# Patient Record
Sex: Male | Born: 1970 | Race: White | Hispanic: No | Marital: Married | Smoking: Current every day smoker
Health system: Southern US, Community
[De-identification: ages and names within clinical notes are randomized; demographics above are authoritative.]

## PROBLEM LIST (undated history)

## (undated) DIAGNOSIS — K649 Unspecified hemorrhoids: Secondary | ICD-10-CM

## (undated) HISTORY — DX: Unspecified hemorrhoids: K64.9

---

## 2006-10-28 ENCOUNTER — Emergency Department: Payer: Self-pay | Admitting: Internal Medicine

## 2009-05-11 ENCOUNTER — Emergency Department: Payer: Self-pay | Admitting: Emergency Medicine

## 2009-08-28 ENCOUNTER — Emergency Department: Payer: Self-pay | Admitting: Emergency Medicine

## 2010-03-05 IMAGING — CR DG CHEST 2V
1 series · 2 of 2 positions shown · non-contrast
Comparison: none

REASON FOR EXAM: SOB
COMMENTS:

[Series 1: view not recorded · 0.17mm/px · 2 of 2 slices shown]
[im 1/2]
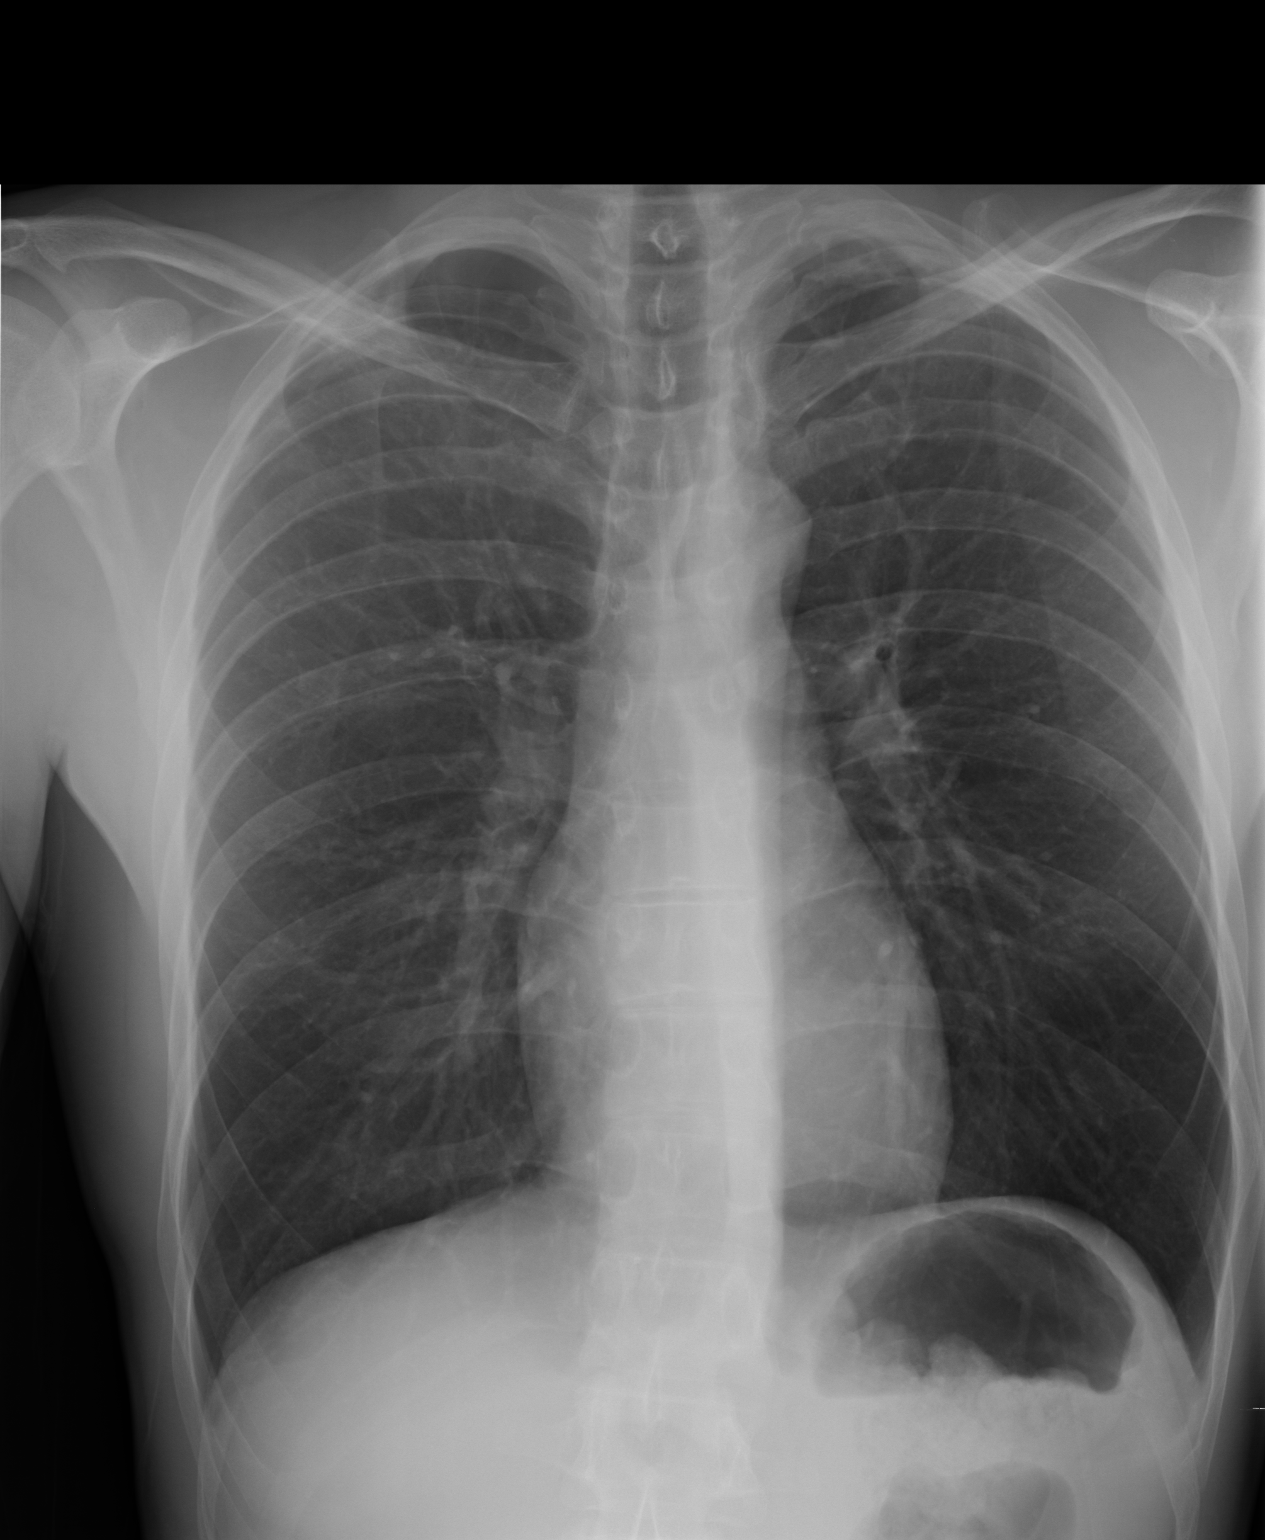
[im 2/2]
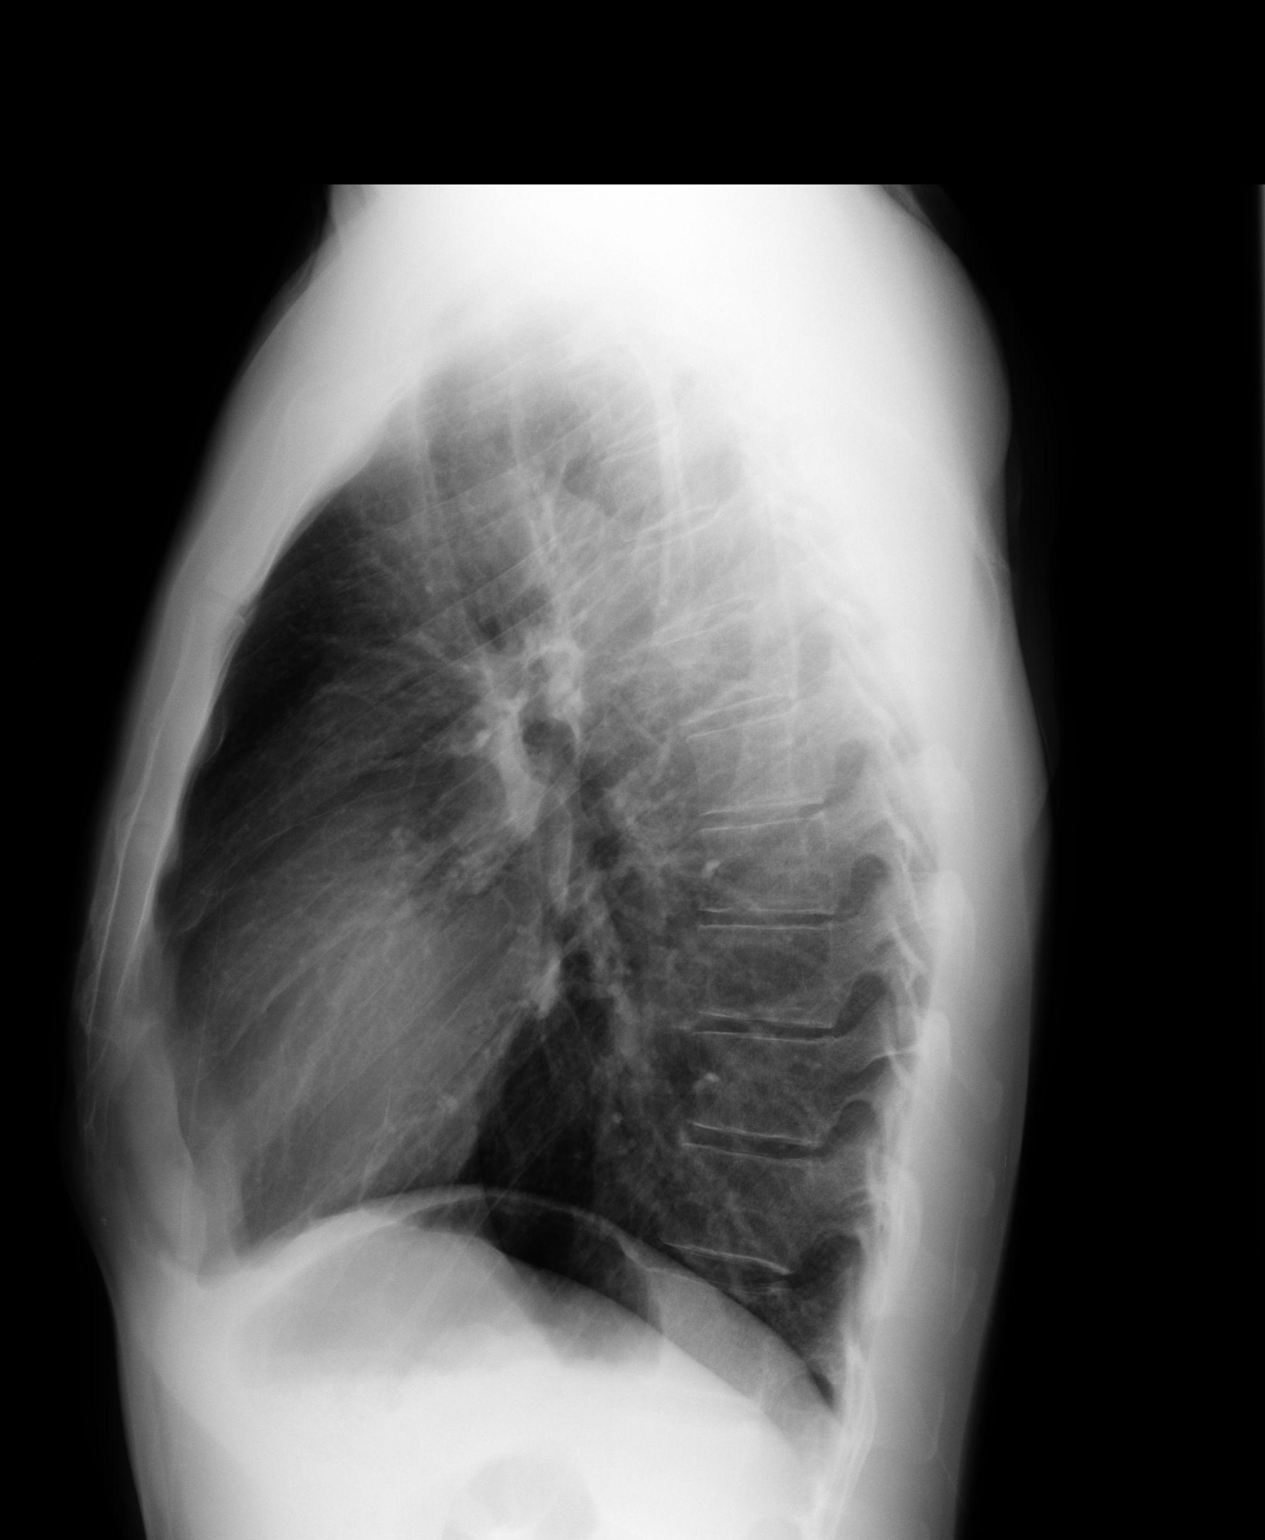

[2 of 2 positions shown; findings below may reference images not displayed]

PROCEDURE:     DXR - DXR CHEST PA (OR AP) AND LATERAL  - August 28, 2009  [DATE]

RESULT:     There are no prior studies for comparison.

The lungs are mildly hyperinflated. There is no focal infiltrate. The
cardiac silhouette is normal in size. There is no pulmonary vascular
congestion. There is no evidence of a pneumothorax nor pleural effusion.
There is a small amount of apical pleural density bilaterally.Minimally
increased perihilar lung markings on the right superiorly are noted.
IMPRESSION: 1. I do not see evidence of focal pneumonia nor CHF.
2. Mildly increased density in the pulmonary apices and in the right
suprahilar region may reflect chronic pulmonary abnormality. There is
hyperinflation as well which could reflect underlying air trapping.

 At minimum a followup PA and lateral chest x-ray following any therapy or
in 10 to 14 days is needed. Chest CT scanning may be ultimately required if
the patient's symptoms persist and remain unexplained.

## 2013-05-07 ENCOUNTER — Emergency Department: Payer: Self-pay | Admitting: Emergency Medicine

## 2014-12-05 DIAGNOSIS — K649 Unspecified hemorrhoids: Secondary | ICD-10-CM

## 2014-12-05 HISTORY — DX: Unspecified hemorrhoids: K64.9

## 2015-12-10 ENCOUNTER — Observation Stay: Payer: Self-pay | Admitting: Anesthesiology

## 2015-12-10 ENCOUNTER — Observation Stay
Admission: EM | Admit: 2015-12-10 | Discharge: 2015-12-11 | Disposition: A | Discharge status: Home / Self Care | Payer: Self-pay | Attending: General Surgery | Admitting: General Surgery

## 2015-12-10 ENCOUNTER — Encounter
Admission: EM | Disposition: A | Discharge status: Home / Self Care | Payer: Self-pay | Source: Home / Self Care | Attending: Emergency Medicine

## 2015-12-10 ENCOUNTER — Encounter: Payer: Self-pay | Admitting: Urgent Care

## 2015-12-10 DIAGNOSIS — K645 Perianal venous thrombosis: Secondary | ICD-10-CM | POA: Insufficient documentation

## 2015-12-10 DIAGNOSIS — Z87891 Personal history of nicotine dependence: Secondary | ICD-10-CM | POA: Insufficient documentation

## 2015-12-10 DIAGNOSIS — K648 Other hemorrhoids: Secondary | ICD-10-CM | POA: Diagnosis present

## 2015-12-10 DIAGNOSIS — K643 Fourth degree hemorrhoids: Principal | ICD-10-CM | POA: Insufficient documentation

## 2015-12-10 DIAGNOSIS — Z880 Allergy status to penicillin: Secondary | ICD-10-CM | POA: Insufficient documentation

## 2015-12-10 HISTORY — PX: HEMORRHOID SURGERY: SHX153

## 2015-12-10 HISTORY — PX: RECTAL EXAM UNDER ANESTHESIA: SHX6399

## 2015-12-10 LAB — BASIC METABOLIC PANEL
ANION GAP: 5 (ref 5–15)
BUN: 15 mg/dL (ref 6–20)
CALCIUM: 9.1 mg/dL (ref 8.9–10.3)
CO2: 29 mmol/L (ref 22–32)
Chloride: 105 mmol/L (ref 101–111)
Creatinine, Ser: 1.08 mg/dL (ref 0.61–1.24)
GFR calc non Af Amer: >60 mL/min (ref >60)
Glucose, Bld: 90 mg/dL (ref 65–99)
Potassium: 4.2 mmol/L (ref 3.5–5.1)
SODIUM: 139 mmol/L (ref 135–145)

## 2015-12-10 LAB — APTT: aPTT: 33 seconds (ref 24–36)

## 2015-12-10 LAB — CBC
HEMATOCRIT: 42.8 % (ref 40.0–52.0)
Hemoglobin: 14.6 g/dL (ref 13.0–18.0)
MCH: 29.4 pg (ref 26.0–34.0)
MCHC: 34.1 g/dL (ref 32.0–36.0)
MCV: 86.1 fL (ref 80.0–100.0)
PLATELETS: 213 10*3/uL (ref 150–440)
RBC: 4.97 MIL/uL (ref 4.40–5.90)
RDW: 14.3 % (ref 11.5–14.5)
WBC: 7.3 10*3/uL (ref 3.8–10.6)

## 2015-12-10 LAB — PROTIME-INR
INR: 1.15
Prothrombin Time: 14.9 seconds (ref 11.4–15.0)

## 2015-12-10 SURGERY — HEMORRHOIDECTOMY
Anesthesia: General | Site: Rectum | Wound class: Dirty or Infected

## 2015-12-10 MED ORDER — ONDANSETRON HCL 4 MG/2ML IJ SOLN
4.0000 mg | Freq: Once | INTRAMUSCULAR | Status: AC
  Administered 2015-12-10: 4 mg via INTRAVENOUS

## 2015-12-10 MED ORDER — MORPHINE SULFATE (PF) 4 MG/ML IV SOLN
4.0000 mg | INTRAVENOUS | Status: DC | PRN
  Administered 2015-12-11 (×2): 4 mg via INTRAVENOUS
  Filled 2015-12-10 (×2): qty 1

## 2015-12-10 MED ORDER — MORPHINE SULFATE (PF) 4 MG/ML IV SOLN
INTRAVENOUS | Status: AC
  Administered 2015-12-10: 4 mg via INTRAVENOUS
  Filled 2015-12-10: qty 1

## 2015-12-10 MED ORDER — LACTATED RINGERS IV SOLN
INTRAVENOUS | Status: DC
  Administered 2015-12-10: 16:00:00 via INTRAVENOUS

## 2015-12-10 MED ORDER — KETAMINE HCL 10 MG/ML IJ SOLN
INTRAMUSCULAR | Status: DC | PRN
  Administered 2015-12-10: 20 mg via INTRAVENOUS

## 2015-12-10 MED ORDER — KETOROLAC TROMETHAMINE 30 MG/ML IJ SOLN
INTRAMUSCULAR | Status: DC | PRN
  Administered 2015-12-10: 30 mg via INTRAVENOUS

## 2015-12-10 MED ORDER — ONDANSETRON 4 MG PO TBDP: 4.0000 mg | ORAL_TABLET | Freq: Four times a day (QID) | ORAL | Status: DC | PRN

## 2015-12-10 MED ORDER — FENTANYL CITRATE (PF) 100 MCG/2ML IJ SOLN: 25.0000 ug | INTRAMUSCULAR | Status: DC | PRN

## 2015-12-10 MED ORDER — LIDOCAINE HCL (CARDIAC) 20 MG/ML IV SOLN
INTRAVENOUS | Status: DC | PRN
  Administered 2015-12-10: 40 mg via INTRAVENOUS

## 2015-12-10 MED ORDER — MIDAZOLAM HCL 2 MG/2ML IJ SOLN
INTRAMUSCULAR | Status: DC | PRN
  Administered 2015-12-10: 2 mg via INTRAVENOUS

## 2015-12-10 MED ORDER — MORPHINE SULFATE (PF) 4 MG/ML IV SOLN
4.0000 mg | INTRAVENOUS | Status: DC | PRN
Start: 2015-12-10 — End: 2015-12-10

## 2015-12-10 MED ORDER — FENTANYL CITRATE (PF) 100 MCG/2ML IJ SOLN
INTRAMUSCULAR | Status: DC | PRN
  Administered 2015-12-10 (×2): 100 ug via INTRAVENOUS

## 2015-12-10 MED ORDER — LIDOCAINE HCL (PF) 4 % IJ SOLN
INTRAMUSCULAR | Status: DC | PRN
  Administered 2015-12-10: 4 mL via RESPIRATORY_TRACT

## 2015-12-10 MED ORDER — DOCUSATE SODIUM 100 MG PO CAPS
100.0000 mg | ORAL_CAPSULE | Freq: Two times a day (BID) | ORAL | Status: DC
  Administered 2015-12-11: 100 mg via ORAL
  Filled 2015-12-10 (×5): qty 1

## 2015-12-10 MED ORDER — OXYCODONE-ACETAMINOPHEN 5-325 MG PO TABS: 1.0000 | ORAL_TABLET | ORAL | Status: DC | PRN

## 2015-12-10 MED ORDER — ONDANSETRON HCL 4 MG/2ML IJ SOLN
INTRAMUSCULAR | Status: AC
  Administered 2015-12-10: 4 mg via INTRAVENOUS
  Filled 2015-12-10: qty 2

## 2015-12-10 MED ORDER — ACETAMINOPHEN 10 MG/ML IV SOLN
INTRAVENOUS | Status: DC | PRN
  Administered 2015-12-10: 1000 mg via INTRAVENOUS

## 2015-12-10 MED ORDER — NALOXONE HCL 0.4 MG/ML IJ SOLN
INTRAMUSCULAR | Status: DC | PRN
  Administered 2015-12-10: 40 ug via INTRAVENOUS

## 2015-12-10 MED ORDER — DIPHENHYDRAMINE HCL 50 MG/ML IJ SOLN: 25.0000 mg | Freq: Four times a day (QID) | INTRAMUSCULAR | Status: DC | PRN

## 2015-12-10 MED ORDER — ONDANSETRON HCL 4 MG/2ML IJ SOLN: 4.0000 mg | Freq: Once | INTRAMUSCULAR | Status: DC | PRN

## 2015-12-10 MED ORDER — HYDROCORTISONE ACETATE 25 MG RE SUPP
25.0000 mg | Freq: Two times a day (BID) | RECTAL | Status: DC
  Filled 2015-12-10 (×4): qty 1

## 2015-12-10 MED ORDER — PROPOFOL 10 MG/ML IV BOLUS
INTRAVENOUS | Status: DC | PRN
  Administered 2015-12-10: 200 mg via INTRAVENOUS

## 2015-12-10 MED ORDER — LIDOCAINE-EPINEPHRINE (PF) 1 %-1:200000 IJ SOLN
INTRAMUSCULAR | Status: DC | PRN
  Administered 2015-12-10: 16 mL via INTRADERMAL

## 2015-12-10 MED ORDER — ONDANSETRON HCL 4 MG/2ML IJ SOLN: 4.0000 mg | Freq: Once | INTRAMUSCULAR | Status: AC

## 2015-12-10 MED ORDER — SUCCINYLCHOLINE CHLORIDE 20 MG/ML IJ SOLN
INTRAMUSCULAR | Status: DC | PRN
  Administered 2015-12-10: 100 mg via INTRAVENOUS

## 2015-12-10 MED ORDER — ONDANSETRON HCL 4 MG/2ML IJ SOLN: 4.0000 mg | Freq: Four times a day (QID) | INTRAMUSCULAR | Status: DC | PRN

## 2015-12-10 MED ORDER — SODIUM CHLORIDE 0.9 % IV BOLUS (SEPSIS)
1000.0000 mL | Freq: Once | INTRAVENOUS | Status: AC
  Administered 2015-12-10: 1000 mL via INTRAVENOUS

## 2015-12-10 MED ORDER — HYDRALAZINE HCL 20 MG/ML IJ SOLN
10.0000 mg | INTRAMUSCULAR | Status: DC | PRN
Start: 2015-12-10 — End: 2015-12-11

## 2015-12-10 MED ORDER — DIPHENHYDRAMINE HCL 25 MG PO CAPS: 25.0000 mg | ORAL_CAPSULE | Freq: Four times a day (QID) | ORAL | Status: DC | PRN

## 2015-12-10 MED ORDER — MORPHINE SULFATE (PF) 4 MG/ML IV SOLN
4.0000 mg | Freq: Once | INTRAVENOUS | Status: AC
  Administered 2015-12-10: 4 mg via INTRAVENOUS

## 2015-12-10 MED ORDER — MORPHINE SULFATE (PF) 4 MG/ML IV SOLN: 4.0000 mg | Freq: Once | INTRAVENOUS | Status: AC

## 2015-12-10 SURGICAL SUPPLY — 28 items
BRIEF STRETCH MATERNITY 2XLG (MISCELLANEOUS) ×4 IMPLANT
DRAPE LAPAROTOMY 100X77 ABD (DRAPES) ×4 IMPLANT
DRAPE LEGGINS SURG 28X43 STRL (DRAPES) ×4 IMPLANT
ELECT CAUTERY NEEDLE TIP 1.0 (MISCELLANEOUS)
ELECT LIGASURE SHORT 9 REUSE (ELECTRODE) IMPLANT
ELECTRODE CAUTERY NEDL TIP 1.0 (MISCELLANEOUS) IMPLANT
GAUZE PACKING IODOFORM 1X5 (MISCELLANEOUS) ×4 IMPLANT
GLOVE BIO SURGEON STRL SZ7.5 (GLOVE) ×12 IMPLANT
GLOVE INDICATOR 8.0 STRL GRN (GLOVE) ×4 IMPLANT
GOWN STRL REUS W/ TWL LRG LVL3 (GOWN DISPOSABLE) ×4 IMPLANT
GOWN STRL REUS W/TWL LRG LVL3 (GOWN DISPOSABLE) ×4
KIT RM TURNOVER CYSTO AR (KITS) ×4 IMPLANT
LABEL OR SOLS (LABEL) ×4 IMPLANT
NEEDLE HYPO 25X1 1.5 SAFETY (NEEDLE) ×4 IMPLANT
NS IRRIG 500ML POUR BTL (IV SOLUTION) ×4 IMPLANT
PACK BASIN MINOR ARMC (MISCELLANEOUS) ×4 IMPLANT
PAD ABD DERMACEA PRESS 5X9 (GAUZE/BANDAGES/DRESSINGS) ×4 IMPLANT
PAD GROUND ADULT SPLIT (MISCELLANEOUS) ×4 IMPLANT
PAD PREP 24X41 OB/GYN DISP (PERSONAL CARE ITEMS) ×4 IMPLANT
SOL PREP PVP 2OZ (MISCELLANEOUS) ×4
SOLUTION PREP PVP 2OZ (MISCELLANEOUS) ×2 IMPLANT
SURGILUBE 2OZ TUBE FLIPTOP (MISCELLANEOUS) ×8 IMPLANT
SUT CHROMIC 2 0 SH (SUTURE) ×16 IMPLANT
SUT VIC AB 2-0 SH 27 (SUTURE)
SUT VIC AB 2-0 SH 27XBRD (SUTURE) IMPLANT
SUT VIC AB 3-0 SH 27 (SUTURE)
SUT VIC AB 3-0 SH 27X BRD (SUTURE) IMPLANT
SYRINGE 10CC LL (SYRINGE) ×4 IMPLANT

## 2015-12-10 NOTE — ED Notes (Signed)
Patient presents with c/o "fist sized hemorrhoids" that have been bothering him since Monday when "something happened". Patient reports that hemorrhoids have swollen, are painful, and bleeding to the point where he cannot get them to stop. [facility] and was advised of a 10 hour wait. Patient reports that he is unable to sit down.

## 2015-12-10 NOTE — Transfer of Care (Signed)
Immediate Anesthesia Transfer of Care Note  Patient: Jerome Short  Procedure(s) Performed: Procedure(s): HEMORRHOIDECTOMY (N/A) RECTAL EXAM UNDER ANESTHESIA  Patient Location: PACU  Anesthesia Type:General  Level of Consciousness: sedated  Airway & Oxygen Therapy: Patient Spontanous Breathing and Patient connected to nasal cannula oxygen  Post-op Assessment: Report given to RN and Post -op Vital signs reviewed and stable  Post vital signs: Reviewed and stable  Last Vitals:  Filed Vitals:   12/10/15 1401 12/10/15 1437  BP:  130/82  Pulse: 50 60  Temp:  36.4 C  Resp:  18    Complications: No apparent anesthesia complications

## 2015-12-10 NOTE — H&P (Signed)
Patient ID: Jerome Short, male   DOB: 03/02/1971, 45 y.o.   MRN: [id number]  CC: Hemorrhoids  HPI Jerome Short is a 45 y.o. male who presents emergency department for evaluation of painful, bleeding hemorrhoids that have been out and bleeding since Monday of this week. Patient states that he has had numerous battles with hemorrhoids over his life ovaries never had anything like this before. Patient states that on Monday he passed a hard stool after which his hemorrhoids came out and started to bleed. They have not gone back NSAIDs. In fact there continued to get larger and more painful throughout the week. This is what brought him to the emergency room today. Patient has no other significant medical history. He denies any fevers, chills, nausea, vomiting, chest pain, shortness of breath. His last bowel movement was yesterday and was loose.  HPI  History reviewed. No pertinent past medical history. patient takes no medications  History reviewed. No pertinent past surgical history. patient has never had any surgery  No family history on file. patient denies any knowledge of any significant family medical history.  Social History Social History  Substance Use Topics  . Smoking status: Former Games developermoker  . Smokeless tobacco: None  . Alcohol Use: No    Allergies  Allergen Reactions  . Penicillins Hives    No current facility-administered medications for this encounter.   No current outpatient prescriptions on file.     Review of Systems A multi-point review of systems was asked and was negative except for the findings I can in the history of present illness  Physical Exam Blood pressure 124/81, pulse 53, temperature 98 F (36.7 C), temperature source Oral, resp. rate 17, height 5\' 9"  (1.753 m), weight 68.04 kg (150 lb), SpO2 98 %. CONSTITUTIONAL: No acute distress. EYES: Pupils are equal, round, and reactive to light, Sclera are non-icteric. EARS, NOSE, MOUTH AND  THROAT: The oropharynx is clear. The oral mucosa is pink and moist. Hearing is intact to voice. LYMPH NODES:  Lymph nodes in the neck are normal. RESPIRATORY:  Lungs are clear. There is normal respiratory effort, with equal breath sounds bilaterally, and without pathologic use of accessory muscles. CARDIOVASCULAR: Heart is regular without murmurs, gallops, or rubs. GI: The abdomen is soft, nontender, and nondistended. There are no palpable masses. There is no hepatosplenomegaly. There are normal bowel sounds in all quadrants. GU: Rectal Exam shows 3 columns of grade 4 internal hemorrhoids with clotted blood on the right anterior and posterior columns. They are exquisitely tender to palpation and unable to be reduced in the emergency room. Patient was unable to tolerate a full rectal exam secondary to pain. MUSCULOSKELETAL: Normal muscle strength and tone. No cyanosis or edema.   SKIN: Turgor is good and there are no pathologic skin lesions or ulcers. NEUROLOGIC: Motor and sensation is grossly normal. Cranial nerves are grossly intact. PSYCH:  Oriented to person, place and time. Affect is normal.  Data Reviewed There are no images and labs associated with this.  I have personally reviewed the patient's imaging, laboratory findings and medical records.    Assessment    3 column grade 4 internal hemorrhoids with evidence of active bleed.     Plan    Grade 4 internal hemorrhoids. Discussed with the patient that his hemorrhoids have progressed to the point where surgery is indicated. However, patient was eating and drinking immediately before I entered the room this morning. Discussed that there would be an 8  hour wait before he go to the operating room. Discussed the procedure of an open hemorrhoidectomy in detail. Also discussed that he be likely performed by my partner Dr. Excell Seltzer as it'll be after 7:00. Patient voiced understanding. We'll admit to the hospital for hydration, labs, pain control.       Time spent with the patient was 30 minutes, with more than 50% of the time spent in face-to-face education, counseling and care coordination.     Ricarda Frame, MD FACS General Surgeon 12/10/2015, 1:42 PM

## 2015-12-10 NOTE — Progress Notes (Signed)
Preoperative Review   Patient is met in the preoperative holding area. The history is reviewed in the chart and with the patient. I personally reviewed the options and rationale as well as the risks of this procedure that have been previously discussed with the patient. All questions asked by the patient and/or family were answered to their satisfaction.  Patient agrees to proceed with this procedure at this time.  Richard E Cooper M.D. FACS  

## 2015-12-10 NOTE — Discharge Instructions (Signed)
May shower Use anusol suppositories twice a day for five days Use preparation H once a day for two weeks after anusol use discontinued See Dr Excell Seltzerooper in office in 10 days

## 2015-12-10 NOTE — ED Provider Notes (Signed)
[facility] Emergency Department Provider Note  ____________________________________________  Time seen: 6:20 AM  I have reviewed the triage vital signs and the nursing notes.   HISTORY  Chief Complaint Hemorrhoids     HPI Jerome Short is a 45 y.o. male presents with "fist size hemorrhoids" currently 10 out of 10 pain. Patient states symptoms started on Monday and has progressively worsened since that time. Patient states inability to sit secondary to discomfort. Patient admits to having "problems with hemorrhoids for many years.     Past medical history Hemorrhoids There are no active problems to display for this patient.   Past surgical history None No current outpatient prescriptions on file.  Allergies Penicillins  No family history on file.  Social History Social History  Substance Use Topics  . Smoking status: Former Games developermoker  . Smokeless tobacco: None  . Alcohol Use: No    Review of Systems  Constitutional: Negative for fever. Eyes: Negative for visual changes. ENT: Negative for sore throat. Cardiovascular: Negative for chest pain. Respiratory: Negative for shortness of breath. Gastrointestinal: Negative for abdominal pain, vomiting and diarrhea. Positive for Rectal pain and bleeding Genitourinary: Negative for dysuria. Musculoskeletal: Negative for back pain. Skin: Negative for rash. Neurological: Negative for headaches, focal weakness or numbness.   10-point ROS otherwise negative.  ____________________________________________   PHYSICAL EXAM:  VITAL SIGNS: ED Triage Vitals  Enc Vitals Group     BP 12/10/15 0552 131/93 mmHg     Pulse Rate 12/10/15 0552 98     Resp 12/10/15 0552 16     Temp 12/10/15 0552 98 F (36.7 C)     Temp Source 12/10/15 0552 Oral     SpO2 12/10/15 0552 96 %     Weight 12/10/15 0552 150 lb (68.04 kg)     Height 12/10/15 0552 5\' 9"  (1.753 m)     Head Cir --      Peak Flow --      Pain Score 12/10/15 0553 10     Pain Loc --      Pain Edu? --      Excl. in GC? --      Constitutional: Alert and oriented. Well appearing and in no distress. Eyes: Conjunctivae are normal. PERRL. Normal extraocular movements. ENT   Head: Normocephalic and atraumatic.   Nose: No congestion/rhinnorhea.   Mouth/Throat: Mucous membranes are moist.   Neck: No stridor. Hematological/Lymphatic/Immunilogical: No cervical lymphadenopathy. Cardiovascular: Normal rate, regular rhythm. Normal and symmetric distal pulses are present in all extremities. No murmurs, rubs, or gallops. Respiratory: Normal respiratory effort without tachypnea nor retractions. Breath sounds are clear and equal bilaterally. No wheezes/rales/rhonchi. Gastrointestinal: Soft and nontender. No distention. There is no CVA tenderness. Large golf ball size thrombosed hemorrhoid noted Genitourinary: deferred Musculoskeletal: Nontender with normal range of motion in all extremities. No joint effusions.  No lower extremity tenderness nor edema. Neurologic:  Normal speech and language. No gross focal neurologic deficits are appreciated. Speech is normal.  Skin:  Skin is warm, dry and intact. No rash noted. Psychiatric: Mood and affect are normal. Speech and behavior are normal. Patient exhibits appropriate insight and judgment.   INITIAL IMPRESSION / ASSESSMENT AND PLAN / ED COURSE  Pertinent labs & imaging results that were available during my care of the patient were reviewed by me and considered in my medical decision making (see chart for details).  Given size of thrombosed hemorrhoid Dr. Colette RibasByrd general surgeon consulted who will evaluate patient in the  emergency department ____________________________________________   FINAL CLINICAL IMPRESSION(S) / ED DIAGNOSES  Final diagnoses:  Thrombosed external hemorrhoid      Darci Current, MD 12/10/15 [phone]

## 2015-12-10 NOTE — Anesthesia Preprocedure Evaluation (Addendum)
Anesthesia Evaluation  Patient identified by MRN, date of birth, ID band Patient awake    Reviewed: Allergy & Precautions, NPO status , Patient's Chart, lab work & pertinent test results  Airway Mallampati: II  TM Distance: >3 FB Neck ROM: Full    Dental  (+) Poor Dentition, Upper Dentures, Partial Lower, Chipped   Pulmonary former smoker,    Pulmonary exam normal breath sounds clear to auscultation       Cardiovascular negative cardio ROS Normal cardiovascular exam     Neuro/Psych negative neurological ROS  negative psych ROS   GI/Hepatic negative GI ROS, Neg liver ROS,   Endo/Other  negative endocrine ROS  Renal/GU negative Renal ROS  negative genitourinary   Musculoskeletal negative musculoskeletal ROS (+)   Abdominal Normal abdominal exam  (+)   Peds negative pediatric ROS (+)  Hematology negative hematology ROS (+)   Anesthesia Other Findings   Reproductive/Obstetrics                            Anesthesia Physical Anesthesia Plan  ASA: II and emergent  Anesthesia Plan: General   Post-op Pain Management:    Induction: Intravenous  Airway Management Planned: Oral ETT  Additional Equipment:   Intra-op Plan:   Post-operative Plan: Extubation in OR  Informed Consent: I have reviewed the patients History and Physical, chart, labs and discussed the procedure including the risks, benefits and alternatives for the proposed anesthesia with the patient or authorized representative who has indicated his/her understanding and acceptance.   Dental advisory given  Plan Discussed with: CRNA and Surgeon  Anesthesia Plan Comments:        Anesthesia Quick Evaluation

## 2015-12-10 NOTE — Anesthesia Procedure Notes (Signed)
Procedure Name: Intubation Date/Time: 12/10/2015 9:44 PM Performed by: Shirlee LimerickMARION, Jacalyn Biggs Pre-anesthesia Checklist: Patient identified, Emergency Drugs available, Suction available and Patient being monitored Patient Re-evaluated:Patient Re-evaluated prior to inductionOxygen Delivery Method: Circle system utilized Preoxygenation: Pre-oxygenation with 100% oxygen Intubation Type: IV induction Laryngoscope Size: Mac and 3 Grade View: Grade I Tube type: Oral Tube size: 7.0 mm Number of attempts: 1 Placement Confirmation: ETT inserted through vocal cords under direct vision,  positive ETCO2 and breath sounds checked- equal and bilateral Secured at: 23 cm Tube secured with: Tape Dental Injury: Teeth and Oropharynx as per pre-operative assessment

## 2015-12-10 NOTE — Op Note (Signed)
12/10/2015  10:52 PM  PATIENT:  Davionte L Veach II  45 y.o. male  PRE-OPERATIVE DIAGNOSIS: Grade 4 hemorrhoids  POST-OPERATIVE DIAGNOSIS:  Same  PROCEDURE: Examination under anesthesia and excision of internal and external hemorrhoids in 3 columns  SURGEON:  Lattie Hawichard E Madolin Twaddle MD, FACS   ANESTHESIA:   Gen. with endotracheal tube   Details of Procedure: This a patient with severe both thrombosed and bleeding hemorrhoids with pain and bleeding. After we discussed rationale for surgery the options of observation risk bleeding infection and recurrence and the likelihood that he would have ongoing pain and ongoing bleeding pressure. A time after the operation. This reviewed for him the preop holding area he understood and agreed to proceed.  Patient was induced general anesthesia a surgical positive was performed he was placed in high lithotomy position and an examination under anesthesia was performed with digital rectal exam. Of note was extremely inflamed and enlarged edematous external hemorrhoids with thrombosis as well as large internal hemorrhoids with bleeding and erosion. This involved 3 columns.  Each column was handled individually by placing 1% lidocaine with epinephrine submucosally grasping with an Allis clamp and placing a 2-0 chromic suture at the apex and then let a lenticular shaped incision was utilized to excise the bleeding internal hemorrhoids and thrombosed external hemorrhoids. Running locking suture was performed to close the wound.  This was done on each of the 3 tremendously inflamed columns. Inspection was performed there was no further bleeding additional lidocaine was placed. Patient hour to the procedure well the workup occasions he was taken down from high lithotomy position and an AVD pad was placed he was then taken to the recovery room in stable condition to be admitted for continued care.   Lattie Hawichard E Adain Geurin, MD FACS

## 2015-12-11 ENCOUNTER — Encounter: Payer: Self-pay | Admitting: Surgery

## 2015-12-11 MED ORDER — OXYCODONE-ACETAMINOPHEN 5-325 MG PO TABS: 1.0000 | ORAL_TABLET | ORAL | Status: DC | PRN

## 2015-12-11 MED ORDER — HYDROCORTISONE ACETATE 25 MG RE SUPP: 25.0000 mg | Freq: Two times a day (BID) | RECTAL | Status: DC

## 2015-12-11 MED ORDER — LIDOCAINE 5 % EX OINT: 1.0000 "application " | TOPICAL_OINTMENT | CUTANEOUS | Status: DC | PRN

## 2015-12-11 MED ORDER — DOCUSATE SODIUM 100 MG PO CAPS: 100.0000 mg | ORAL_CAPSULE | Freq: Two times a day (BID) | ORAL | Status: AC

## 2015-12-11 NOTE — Final Progress Note (Signed)
1 Day Post-Op   Subjective:  Patient had uneventful night.  Vital signs in last 24 hours: Temp:  [97.4 F (36.3 C)-99 F (37.2 C)] 97.7 F (36.5 C) (01/06 0552) Pulse Rate:  [50-79] 68 (01/06 0552) Resp:  [11-19] 19 (01/06 0552) BP: (114-140)/(62-96) 135/80 mmHg (01/06 0552) SpO2:  [94 %-100 %] 98 % (01/06 0552)    Intake/Output from previous day: 01/05 0701 - 01/06 0700 In: 2340 [P.O.:300; I.V.:2040] Out: 25 [Blood:25]  GI: soft, non-tender; bowel sounds normal; no masses,  no organomegaly  Lab Results:  CBC  Recent Labs  12/10/15 1407  WBC 7.3  HGB 14.6  HCT 42.8  PLT 213   CMP     Component Value Date/Time   NA 139 12/10/2015 1407   K 4.2 12/10/2015 1407   CL 105 12/10/2015 1407   CO2 29 12/10/2015 1407   GLUCOSE 90 12/10/2015 1407   BUN 15 12/10/2015 1407   CREATININE 1.08 12/10/2015 1407   CALCIUM 9.1 12/10/2015 1407   GFRNONAA >60 12/10/2015 1407   GFRAA >60 12/10/2015 1407   PT/INR  Recent Labs  12/10/15 1407  LABPROT 14.9  INR 1.15    Studies/Results: No results found.  Assessment/Plan: 45 year old male status post 3 column open hemorrhoidectomy for bleeding grade 4 internal hemorrhoids. Discussed appropriate use of as needed medications his outpatient status. Patient voiced understanding and wishes to go home. We'll discharge home with follow-up in clinic in 2 weeks.   Ricarda Frameharles Berlin Mokry, MD FACS General Surgeon  12/11/2015

## 2015-12-11 NOTE — Progress Notes (Signed)
Pt to be discharged per MD order. Iv removed. Instructions reviewed with pt. All questions answered. Provided education on supp administration. Pt prefers to walk out as he can not tolerate sitting

## 2015-12-11 NOTE — Discharge Summary (Signed)
Patient ID: Jerome Short MRN: [id number] DOB/AGE: 45-May-1972 45 y.o.  Admit date: 12/10/2015 Discharge date: 12/11/2015  Discharge Diagnoses:  Grade 4 internal hemorrhoids  Procedures Performed: 3 column open hemorrhoidectomy  Discharged Condition: good  Hospital Course: Patient admitted to observation secondary to grade 4 internal hemorrhoids. Taken to the operating room for an open hemorrhoidectomy last night without any event. Tolerated she well. He will discharged home on postoperative day #1.  Discharge Orders:  discharge home  Disposition: Home  Discharge Medications:  Current facility-administered medications:   .  docusate sodium (COLACE) capsule 100 mg, 100 mg, Oral, BID, Lattie Hawichard E Cooper, MD, 100 mg at 12/11/15 0010 .  hydrocortisone (ANUSOL-HC) suppository 25 mg, 25 mg, Rectal, BID, Lattie Hawichard E Cooper, MD, 25 mg at 12/11/15 0010 .  oxyCODONE-acetaminophen (PERCOCET/ROXICET) 5-325 MG per tablet 1 tablet, 1 tablet, Oral, Q4H PRN, Lattie Hawichard E Cooper, MD  Follwup: Follow-up Information    Follow up with Dionne Miloichard Cooper, MD In 10 days.   Specialty:  Surgery   Why:  For wound re-check   Contact information:   [address] [place] [id number] [phone]       Signed: Ricarda FrameCharles Lawonda Pretlow 12/11/2015, 8:36 AM

## 2015-12-14 LAB — SURGICAL PATHOLOGY

## 2015-12-14 NOTE — Anesthesia Postprocedure Evaluation (Signed)
Anesthesia Post Note  Patient: Linley L Nohr II  Procedure(s) Performed: Procedure(s) (LRB): HEMORRHOIDECTOMY (N/A) RECTAL EXAM UNDER ANESTHESIA  Patient location during evaluation: PACU Anesthesia Type: General Level of consciousness: awake Pain management: pain level controlled Vital Signs Assessment: post-procedure vital signs reviewed and stable Respiratory status: spontaneous breathing Cardiovascular status: blood pressure returned to baseline Anesthetic complications: no    Last Vitals:  Filed Vitals:   12/11/15 0552 12/11/15 0855  BP: 135/80 146/99  Pulse: 68 90  Temp: 36.5 C 36.8 C  Resp: 19 20    Last Pain:  Filed Vitals:   12/11/15 0959  PainSc: 4                  Anvi Mangal

## 2015-12-15 ENCOUNTER — Telehealth: Payer: Self-pay

## 2015-12-15 NOTE — Telephone Encounter (Signed)
Post-discharge call made to patient at this time. Received a busy signal. Will try back at a later time.

## 2015-12-21 NOTE — Telephone Encounter (Signed)
Called once again at this time. Received a busy signal once again. Unable to leave voicemail.

## 2015-12-25 ENCOUNTER — Encounter: Payer: Self-pay | Admitting: Surgery

## 2016-01-05 ENCOUNTER — Telehealth: Payer: Self-pay | Admitting: Surgery

## 2016-01-05 ENCOUNTER — Encounter: Payer: Self-pay | Admitting: Surgery

## 2016-01-05 NOTE — Telephone Encounter (Signed)
Left a message with patients wife that he MUST call and reschedule appointment. Patients voice mail full

## 2016-01-06 ENCOUNTER — Telehealth: Payer: Self-pay

## 2016-01-06 ENCOUNTER — Telehealth: Payer: Self-pay | Admitting: Surgery

## 2016-01-06 NOTE — Telephone Encounter (Signed)
Patient has been a no show for appointments on 1/20 and 1/3. I know that you have spoke with me about each one and have called patient on multiple attempts. Please call patient again and ensure that he has a post-op appointment is made as patient has abnormal pathology and needs to be seen in office as soon as possible.

## 2016-01-06 NOTE — Telephone Encounter (Signed)
Attempted to call patient to reschedule appointment but his voice mail isn't set up so I can leave a message but I did call and leave a voice message with his wife for him to call and reschedule.

## 2016-01-07 NOTE — Telephone Encounter (Signed)
Called patient at this time. He states that he has not been in because of his outstanding balance with hospital that he is worried about paying. Explained to patient that this visit would be covered under his surgery costs and he would not need to worry about a co-pay for his post-op appointment. Patient agrees to come in on 01/29/16. Placed on schedule with Dr. Excell Seltzer. Also, spoke with patient about [facility]. Application sent to patient at this time via mail.

## 2016-01-07 NOTE — Telephone Encounter (Signed)
Left a voice message with patient to call the office to reschedule appointment to discuss path. Report.

## 2016-01-29 ENCOUNTER — Ambulatory Visit (INDEPENDENT_AMBULATORY_CARE_PROVIDER_SITE_OTHER): Payer: Self-pay | Admitting: Surgery

## 2016-01-29 ENCOUNTER — Encounter: Payer: Self-pay | Admitting: Surgery

## 2016-01-29 VITALS — BP 137/78 | HR 83 | Temp 98.4°F | Ht 69.0 in | Wt 152.4 lb

## 2016-01-29 DIAGNOSIS — K645 Perianal venous thrombosis: Secondary | ICD-10-CM

## 2016-01-29 NOTE — Progress Notes (Signed)
Outpatient postop visit  01/29/2016  Jerome Short is an 45 y.o. male.    Procedure: Hemorrhoidectomy  CC: No pain  HPI: Patient had an emergency hemorrhoidectomy for multiple thrombosis with grade 4 hemorrhoids. He states that the first 2 weeks were difficult the first 5 days for excruciating but now he is having no plain and no bleeding he has dealt with this for many many years and states that this is the best he is ever been.  Medications reviewed.    Physical Exam:  There were no vitals taken for this visit.    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 I talked to the pathologist who reviewed these and we discussed the pathophysiology of this condition. Currently the patient is doing very well I will arrange for him to have a colonoscopy and reexamine him in a few months. If there are ever any changes concerning his exam I would send him to a colorectal surgeon for further considerations.  Lattie Haw, MD, FACS

## 2016-01-29 NOTE — Patient Instructions (Signed)
Follow-up with our office in 3 months. Call to make your appointment.  You will also need a Colonoscopy set-up for sometime within this year. Please call our office when you are ready to get this done.

## 2017-06-23 ENCOUNTER — Encounter: Payer: Self-pay | Admitting: Emergency Medicine

## 2017-06-23 ENCOUNTER — Emergency Department
Admission: EM | Admit: 2017-06-23 | Discharge: 2017-06-23 | Disposition: A | Discharge status: Home / Self Care | Payer: Self-pay | Attending: Emergency Medicine | Admitting: Emergency Medicine

## 2017-06-23 DIAGNOSIS — X58XXXA Exposure to other specified factors, initial encounter: Secondary | ICD-10-CM | POA: Insufficient documentation

## 2017-06-23 DIAGNOSIS — Z79899 Other long term (current) drug therapy: Secondary | ICD-10-CM | POA: Insufficient documentation

## 2017-06-23 DIAGNOSIS — F17210 Nicotine dependence, cigarettes, uncomplicated: Secondary | ICD-10-CM | POA: Insufficient documentation

## 2017-06-23 DIAGNOSIS — Y929 Unspecified place or not applicable: Secondary | ICD-10-CM | POA: Insufficient documentation

## 2017-06-23 DIAGNOSIS — T1501XA Foreign body in cornea, right eye, initial encounter: Secondary | ICD-10-CM | POA: Insufficient documentation

## 2017-06-23 DIAGNOSIS — T1591XA Foreign body on external eye, part unspecified, right eye, initial encounter: Secondary | ICD-10-CM

## 2017-06-23 DIAGNOSIS — S0501XA Injury of conjunctiva and corneal abrasion without foreign body, right eye, initial encounter: Secondary | ICD-10-CM

## 2017-06-23 DIAGNOSIS — Y999 Unspecified external cause status: Secondary | ICD-10-CM | POA: Insufficient documentation

## 2017-06-23 DIAGNOSIS — Y939 Activity, unspecified: Secondary | ICD-10-CM | POA: Insufficient documentation

## 2017-06-23 MED ORDER — FLUORESCEIN SODIUM 0.6 MG OP STRP
1.0000 | ORAL_STRIP | Freq: Once | OPHTHALMIC | Status: AC
  Administered 2017-06-23: 1 via OPHTHALMIC
  Filled 2017-06-23: qty 1

## 2017-06-23 MED ORDER — TETRACAINE HCL 0.5 % OP SOLN
2.0000 [drp] | Freq: Once | OPHTHALMIC | Status: AC
  Administered 2017-06-23: 2 [drp] via OPHTHALMIC
  Filled 2017-06-23: qty 4

## 2017-06-23 MED ORDER — FLUORESCEIN SODIUM 0.6 MG OP STRP
1.0000 | ORAL_STRIP | Freq: Once | OPHTHALMIC | Status: AC
  Administered 2017-06-23: 1 via OPHTHALMIC

## 2017-06-23 MED ORDER — HYDROCODONE-ACETAMINOPHEN 5-325 MG PO TABS: 1.0000 | ORAL_TABLET | ORAL | 0 refills | Status: AC | PRN

## 2017-06-23 MED ORDER — POLYMYXIN B-TRIMETHOPRIM 10000-0.1 UNIT/ML-% OP SOLN
1.0000 [drp] | Freq: Four times a day (QID) | OPHTHALMIC | Status: DC
  Administered 2017-06-23: 1 [drp] via OPHTHALMIC
  Filled 2017-06-23: qty 10

## 2017-06-23 MED ORDER — FLUORESCEIN SODIUM 0.6 MG OP STRP
ORAL_STRIP | OPHTHALMIC | Status: AC
  Filled 2017-06-23: qty 1

## 2017-06-23 NOTE — ED Notes (Signed)
Both eyes 20/30

## 2017-06-23 NOTE — ED Notes (Signed)
Pt states that he has something in his right eye. He was working on a car last night and he thinks something fell in it. Eye was matted shut this morning. Family at bedside.

## 2017-06-23 NOTE — ED Provider Notes (Signed)
[facility] Emergency Department Provider Note  ____________________________________________   First MD Initiated Contact with Patient 06/23/17 [phone]     (approximate)  I have reviewed the triage vital signs and the nursing notes.   HISTORY  Chief Complaint Eye Pain    HPI Jerome Short is a 46 y.o. male with no relevant past medical history who presents for evaluation of foreign body sensation in his right eye.  He worked on a car last night and was up out of the vehicle and did not wear his usual eye protection.  He states that one point he feels like something fell on it.  He tried washing it out but did not get any relief.  He rubbed on it a lot and wonders if maybe he scratched a "did some damage".  When he awoke this morning his eye was "matted shut".  The eye is red, itching, and burning with a moderate intensity.  Nothing makes it better nor worse.  He denies any recent viral illnesses and has no headache, visual acuity changes, vomiting, shortness of breath, abdominal pain.   Past Medical History:  Diagnosis Date  . Hemorrhoid 2016    Patient Active Problem List   Diagnosis Date Noted  . Internal hemorrhoids with complication 12/10/2015  . Thrombosed external hemorrhoid     Past Surgical History:  Procedure Laterality Date  . HEMORRHOID SURGERY N/A 12/10/2015   Procedure: HEMORRHOIDECTOMY;  Surgeon: Lattie Hawichard E Cooper, MD;  Location: [facility] ORS;  Service: General;  Laterality: N/A;  . RECTAL EXAM UNDER ANESTHESIA  12/10/2015   Procedure: RECTAL EXAM UNDER ANESTHESIA;  Surgeon: Lattie Hawichard E Cooper, MD;  Location: [facility] ORS;  Service: General;;    Prior to Admission medications   Medication Sig Start Date End Date Taking? Authorizing Provider  docusate sodium (COLACE) 100 MG capsule Take 1 capsule (100 mg total) by mouth 2 (two) times daily. 12/11/15   Lattie Hawooper, Richard E, MD  HYDROcodone-acetaminophen (NORCO/VICODIN) 5-325 MG tablet Take 1-2  tablets by mouth every 4 (four) hours as needed for moderate pain. 06/23/17   Loleta RoseForbach, Joeangel Jeanpaul, MD    Allergies Penicillins and Oxycodone  Family History  Problem Relation Age of Onset  . Diabetes Mother   . Diabetes Father   . Cancer Father        Melanoma  . Heart disease Father        4 MI, and CABG    Social History Social History  Substance Use Topics  . Smoking status: Current Every Day Smoker    Packs/day: 1.00    Types: Cigarettes    Last attempt to quit: 01/29/2012  . Smokeless tobacco: Never Used  . Alcohol use No    Review of Systems Constitutional: No fever/chills Eyes: Pain, burning, and irritation in the right eye with a foreign body sensation.  No visual deficits. ENT: No sore throat.  No recent congestion or runny nose Cardiovascular: Denies chest pain. Respiratory: Denies shortness of breath. Gastrointestinal: No abdominal pain.  No nausea, no vomiting.  No diarrhea.  No constipation.  ____________________________________________   PHYSICAL EXAM:  VITAL SIGNS: ED Triage Vitals  Enc Vitals Group     BP 06/23/17 0607 122/81     Pulse Rate 06/23/17 0607 68     Resp 06/23/17 0607 18     Temp 06/23/17 0607 98 F (36.7 C)     Temp Source 06/23/17 0607 Oral     SpO2 06/23/17 0607 96 %  Weight 06/23/17 0605 72.6 kg (160 lb)     Height 06/23/17 0605 1.778 m (5\' 10" )     Head Circumference --      Peak Flow --      Pain Score 06/23/17 0605 8     Pain Loc --      Pain Edu? --      Excl. in GC? --     Constitutional: Alert and oriented. Well appearing and in no acute distress. Eyes: Left eye is normal in appearance and using the Wood's lamp with fluorescein strip.  Conjunctivae injected in the right eye.  Small dark "dot" is observed at about the 10:00 position on the cornea when observing directly with no assistance.  After tetracaine administration and fluorescein strip, a small corneal defect with a small metallic foreign body is visible at the same  position.  See ED course for more details.  There is no dendritic pattern and there are a few other scattered corneal defects that may represent the patient rubbing his eye with a foreign body present before it embedded in its current location Head: Atraumatic. Nose: No congestion/rhinnorhea. Respiratory: Normal respiratory effort.  No retractions.  Musculoskeletal: No lower extremity tenderness nor edema. No gross deformities of extremities. Neurologic:  Normal speech and language. No gross focal neurologic deficits are appreciated.  Skin:  Skin is warm, dry and intact. No rash noted. Psychiatric: Mood and affect are normal. Speech and behavior are normal.  ____________________________________________   LABS (all labs ordered are listed, but only abnormal results are displayed)  Labs Reviewed - No data to display ____________________________________________  EKG  None - EKG not ordered by ED physician ____________________________________________  RADIOLOGY   No results found.  ____________________________________________   PROCEDURES  Critical Care performed: No   Procedure(s) performed:   Procedures  I applied tetracaine and fluorescein strip to both eyes.  Using a tetracaine soaked cotton swab and the portable slit lamp, I was able to  Gently remove a small metallic foreign body, , too small to measure but clearly visible under the slit lamp.  The patient felt immediate relief after the tract was removed. ____________________________________________   INITIAL IMPRESSION / ASSESSMENT AND PLAN / ED COURSE  Pertinent labs & imaging results that were available during my care of the patient were reviewed by me and considered in my medical decision making (see chart for details).  As documented in the procedure note above a small metallic foreign body was removed.  He had immediate relief of his discomfort upon administration of tetracaine which is reassuring that he is  not also have a concurrent uveitis/iritis.  Given that he has a corneal deficit and a few small scratches probably due to rubbing the metallic body along his eye before it embedded at the 10:00 position, I discussed erythromycin ointment versus eyedrops with him.  He prefers to eyedrops.  I have ordered them from pharmacy and we will give him a first dose here and he will take the drops home to administer them once every 6 hours for the next 5 days.  He does not have conjunctivitis but this should help prevent secondary infection while the epithelial cells regrow.  I encouraged him to follow up with ophthalmology at the beginning of next week to make sure the symptoms have resolved and I gave my usual and customary return precautions for returning to the ED should his symptoms worsen.  There is no evidence of any globe penetration and no indication for  obtaining a CT scan of his orbits at this time.  He does not wear contact lenses nor glasses.  He is comfortable with the plan.   Clinical Course as of Jun 24 755  Fri Jun 23, 2017  0730 I reviewed the patient's prescription history over the last 12 months in the multi-state controlled substances database(s) that includes [place], [place], [place], [place], [place], [place], [place], [place], [place], [place], [place], [place], [place], and [place].  The patient has filled no controlled substances during that time.  I will give her a small prescription for narcotics given the corneal abrasion.  [CF]    Clinical Course User Index [CF] Loleta Rose, MD    ____________________________________________  FINAL CLINICAL IMPRESSION(S) / ED DIAGNOSES  Final diagnoses:  Abrasion of right cornea, initial encounter  Foreign body of right eye, initial encounter     MEDICATIONS GIVEN DURING THIS VISIT:  Medications  trimethoprim-polymyxin b (POLYTRIM) ophthalmic solution 1 drop (1 drop Right Eye Given 06/23/17 0752)    fluorescein ophthalmic strip 1 strip (1 strip Both Eyes Given 06/23/17 0633)  tetracaine (PONTOCAINE) 0.5 % ophthalmic solution 2 drop (2 drops Both Eyes Given 06/23/17 4098)  fluorescein ophthalmic strip 1 strip (1 strip Both Eyes Given 06/23/17 0700)     NEW OUTPATIENT MEDICATIONS STARTED DURING THIS VISIT:  Discharge Medication List as of 06/23/2017  7:32 AM    START taking these medications   Details  HYDROcodone-acetaminophen (NORCO/VICODIN) 5-325 MG tablet Take 1-2 tablets by mouth every 4 (four) hours as needed for moderate pain., Starting Fri 06/23/2017, Print        Discharge Medication List as of 06/23/2017  7:32 AM      Discharge Medication List as of 06/23/2017  7:32 AM       Note:  This document was prepared using Dragon voice recognition software and may include unintentional dictation errors.    Loleta Rose, MD 06/23/17 [phone]

## 2017-06-23 NOTE — Discharge Instructions (Signed)
You had a small foreign body embedded in your corena.  Please read through the included information and use the provided eye drops as recommended (one drop in your right eye every 6 hours for five days).  Follow up with an eye doctor such as Dr. Inez PilgrimBrasington or one of his colleagues on Monday or Tuesday of this coming week.    Return to the emergency department if you develop new or worsening symptoms that concern you.

## 2017-06-23 NOTE — ED Notes (Signed)
Received report from Doree FudgeKala Keen RN,  Care assumed.

## 2017-06-23 NOTE — ED Triage Notes (Signed)
Patient ambulatory to triage with steady gait, without difficulty or distress noted; pt reports was working on a car last night and awoke with persistent pain to right eye with sensation of foreign body

## 2017-09-15 ENCOUNTER — Encounter: Payer: Self-pay | Admitting: Emergency Medicine

## 2017-09-15 DIAGNOSIS — Z5321 Procedure and treatment not carried out due to patient leaving prior to being seen by health care provider: Secondary | ICD-10-CM | POA: Insufficient documentation

## 2017-09-15 DIAGNOSIS — H5712 Ocular pain, left eye: Secondary | ICD-10-CM | POA: Insufficient documentation

## 2017-09-15 NOTE — ED Triage Notes (Signed)
Patient arrived via POV with co foreign body ion left eye after he was sanding metal while wearing safety goggles and felt something to into his left eye.  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

## 2017-09-16 ENCOUNTER — Emergency Department
Admission: EM | Admit: 2017-09-16 | Discharge: 2017-09-16 | Disposition: A | Discharge status: Home / Self Care | Payer: Self-pay | Attending: Emergency Medicine | Admitting: Emergency Medicine

## 2017-09-18 ENCOUNTER — Telehealth: Payer: Self-pay | Admitting: Emergency Medicine
